# Patient Record
Sex: Female | Born: 1980 | Race: Black or African American | Hispanic: No | Marital: Single | Smoking: Current every day smoker
Health system: Southern US, Community
[De-identification: ages and names within clinical notes are randomized; demographics above are authoritative.]

## PROBLEM LIST (undated history)

## (undated) DIAGNOSIS — D649 Anemia, unspecified: Secondary | ICD-10-CM

---

## 2006-06-08 ENCOUNTER — Emergency Department (HOSPITAL_COMMUNITY): Admission: EM | Admit: 2006-06-08 | Discharge: 2006-06-08 | Payer: Self-pay | Admitting: Emergency Medicine

## 2006-08-08 ENCOUNTER — Emergency Department: Payer: Self-pay | Admitting: Emergency Medicine

## 2006-08-14 ENCOUNTER — Emergency Department: Payer: Self-pay | Admitting: Emergency Medicine

## 2012-08-02 ENCOUNTER — Encounter (HOSPITAL_COMMUNITY): Payer: Self-pay | Admitting: *Deleted

## 2012-08-02 ENCOUNTER — Emergency Department (HOSPITAL_COMMUNITY): Payer: No Typology Code available for payment source

## 2012-08-02 ENCOUNTER — Emergency Department (HOSPITAL_COMMUNITY)
Admission: EM | Admit: 2012-08-02 | Discharge: 2012-08-02 | Disposition: A | Discharge status: Home / Self Care | Payer: No Typology Code available for payment source | Attending: Emergency Medicine | Admitting: Emergency Medicine

## 2012-08-02 DIAGNOSIS — IMO0002 Reserved for concepts with insufficient information to code with codable children: Secondary | ICD-10-CM | POA: Insufficient documentation

## 2012-08-02 DIAGNOSIS — Y939 Activity, unspecified: Secondary | ICD-10-CM | POA: Insufficient documentation

## 2012-08-02 DIAGNOSIS — Y92410 Unspecified street and highway as the place of occurrence of the external cause: Secondary | ICD-10-CM | POA: Insufficient documentation

## 2012-08-02 DIAGNOSIS — S0993XA Unspecified injury of face, initial encounter: Secondary | ICD-10-CM | POA: Insufficient documentation

## 2012-08-02 DIAGNOSIS — S298XXA Other specified injuries of thorax, initial encounter: Secondary | ICD-10-CM | POA: Insufficient documentation

## 2012-08-02 DIAGNOSIS — T148XXA Other injury of unspecified body region, initial encounter: Secondary | ICD-10-CM

## 2012-08-02 DIAGNOSIS — S46909A Unspecified injury of unspecified muscle, fascia and tendon at shoulder and upper arm level, unspecified arm, initial encounter: Secondary | ICD-10-CM | POA: Insufficient documentation

## 2012-08-02 DIAGNOSIS — R110 Nausea: Secondary | ICD-10-CM | POA: Insufficient documentation

## 2012-08-02 DIAGNOSIS — S4980XA Other specified injuries of shoulder and upper arm, unspecified arm, initial encounter: Secondary | ICD-10-CM | POA: Insufficient documentation

## 2012-08-02 MED ORDER — ONDANSETRON 4 MG PO TBDP
8.0000 mg | ORAL_TABLET | Freq: Once | ORAL | Status: AC
  Administered 2012-08-02: 8 mg via ORAL
  Filled 2012-08-02: qty 2

## 2012-08-02 MED ORDER — KETOROLAC TROMETHAMINE 60 MG/2ML IM SOLN
60.0000 mg | Freq: Once | INTRAMUSCULAR | Status: AC
  Administered 2012-08-02: 60 mg via INTRAMUSCULAR
  Filled 2012-08-02: qty 2

## 2012-08-02 MED ORDER — HYDROCODONE-ACETAMINOPHEN 5-325 MG PO TABS: 2.0000 | ORAL_TABLET | ORAL | Status: DC | PRN

## 2012-08-02 MED ORDER — CYCLOBENZAPRINE HCL 10 MG PO TABS: 10.0000 mg | ORAL_TABLET | Freq: Two times a day (BID) | ORAL | Status: AC | PRN

## 2012-08-02 MED ORDER — DIAZEPAM 5 MG PO TABS
10.0000 mg | ORAL_TABLET | Freq: Once | ORAL | Status: AC
  Administered 2012-08-02: 10 mg via ORAL
  Filled 2012-08-02 (×2): qty 1

## 2012-08-02 NOTE — ED Notes (Signed)
C-collar removed per ED PA during assessment. Patient transported to X-ray
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
## 2012-08-02 NOTE — ED Notes (Signed)
Medicated for c/o nausea
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
## 2012-08-04 NOTE — ED Provider Notes (Signed)
Medical screening examination/treatment/procedure(s) were performed by non-physician practitioner and as supervising physician I was immediately available for consultation/collaboration.  Toy Baker, MD 08/04/12 [phone]

## 2014-06-28 ENCOUNTER — Encounter (HOSPITAL_COMMUNITY): Payer: Self-pay | Admitting: Emergency Medicine

## 2014-06-28 ENCOUNTER — Emergency Department (HOSPITAL_COMMUNITY): Payer: Medicaid Other

## 2014-06-28 ENCOUNTER — Emergency Department (HOSPITAL_COMMUNITY)
Admission: EM | Admit: 2014-06-28 | Discharge: 2014-06-28 | Disposition: A | Discharge status: Home / Self Care | Payer: Medicaid Other | Attending: Emergency Medicine | Admitting: Emergency Medicine

## 2014-06-28 DIAGNOSIS — Y9389 Activity, other specified: Secondary | ICD-10-CM | POA: Insufficient documentation

## 2014-06-28 DIAGNOSIS — Z720 Tobacco use: Secondary | ICD-10-CM | POA: Insufficient documentation

## 2014-06-28 DIAGNOSIS — X58XXXA Exposure to other specified factors, initial encounter: Secondary | ICD-10-CM | POA: Diagnosis not present

## 2014-06-28 DIAGNOSIS — Z862 Personal history of diseases of the blood and blood-forming organs and certain disorders involving the immune mechanism: Secondary | ICD-10-CM | POA: Insufficient documentation

## 2014-06-28 DIAGNOSIS — M25561 Pain in right knee: Secondary | ICD-10-CM | POA: Diagnosis present

## 2014-06-28 DIAGNOSIS — Y9289 Other specified places as the place of occurrence of the external cause: Secondary | ICD-10-CM | POA: Diagnosis not present

## 2014-06-28 DIAGNOSIS — S86911A Strain of unspecified muscle(s) and tendon(s) at lower leg level, right leg, initial encounter: Secondary | ICD-10-CM

## 2014-06-28 DIAGNOSIS — Y998 Other external cause status: Secondary | ICD-10-CM | POA: Insufficient documentation

## 2014-06-28 HISTORY — DX: Anemia, unspecified: D64.9

## 2014-06-28 MED ORDER — TETRACAINE HCL 0.5 % OP SOLN: 2.0000 [drp] | Freq: Once | OPHTHALMIC | Status: DC

## 2014-06-28 MED ORDER — FLUORESCEIN SODIUM 10 % IJ SOLN: 500.0000 mg | Freq: Once | INTRAMUSCULAR | Status: DC

## 2014-06-28 MED ORDER — HYDROCODONE-ACETAMINOPHEN 5-325 MG PO TABS: 1.0000 | ORAL_TABLET | Freq: Four times a day (QID) | ORAL | Status: AC | PRN

## 2014-06-28 NOTE — ED Provider Notes (Signed)
CSN: [id number]     Arrival date & time 06/28/14  1312 History  This chart was scribed for non-physician practitioner working with Joanna Golden, by Richarda Overlieichard Holland, ED Scribe. This patient was seen in room TR10C/TR10C and the patient's care was started at 1:53 PM.    Chief Complaint  Patient presents with  . Knee Pain    The history is provided by the patient. No language interpreter was used.   HPI Comments: Joanna Golden is a 33 y.o. female who presents to the Emergency Department complaining of right knee pain that started a few days ago. Pt states she was doing lunges with her son about 3 days ago when she noticed the pain after about her 5th lunge. She denies falling or any previous injuries to the area. Pt states she took advil yesterday which failed to relieve her pain. She states her last period was about 3 weeks ago. Pt reports no pertinent past medical history. She reports NKDA.   Past Medical History  Diagnosis Date  . Anemia    Past Surgical History  Procedure Laterality Date  . Cesarean section     No family history on file. History  Substance Use Topics  . Smoking status: Current Every Day Smoker  . Smokeless tobacco: Not on file  . Alcohol Use: No   OB History    No data available     Review of Systems  Musculoskeletal: Positive for arthralgias.  All other systems reviewed and are negative.   Allergies  Review of patient's allergies indicates no known allergies.  Home Medications   Prior to Admission medications   Medication Sig Start Date End Date Taking? Authorizing Provider  cyclobenzaprine (FLEXERIL) 10 MG tablet Take 1 tablet (10 mg total) by mouth 2 (two) times daily as needed for muscle spasms. 08/02/12   Emilia BeckKaitlyn Szekalski, PA-C  HYDROcodone-acetaminophen (NORCO/VICODIN) 5-325 MG per tablet Take 2 tablets by mouth every 4 (four) hours as needed for pain. 08/02/12   Kaitlyn Szekalski, PA-C   BP 123/69 mmHg  Pulse 65  Temp(Src) 97.9 F  (36.6 C) (Oral)  SpO2 100% Physical Exam  Constitutional: She is oriented to person, place, and time. She appears well-developed and well-nourished. No distress.  HENT:  Head: Normocephalic and atraumatic.  Eyes: Right eye exhibits no discharge. Left eye exhibits no discharge.  Neck: Neck supple. No tracheal deviation present.  Cardiovascular: Normal rate.   Pulmonary/Chest: Effort normal. No respiratory distress.  Abdominal: She exhibits no distension.  Musculoskeletal:  Generalized tenderness to the right knee. Pt has full rom. Pulses intact  Neurological: She is alert and oriented to person, place, and time.  Skin: Skin is warm. She is not diaphoretic.  Psychiatric: She has a normal mood and affect. Her behavior is normal.  Nursing note and vitals reviewed.   ED Course  Procedures   DIAGNOSTIC STUDIES: Oxygen Saturation is 100% on RA, normal by my interpretation.    COORDINATION OF CARE: 1:57 PM Discussed treatment plan with pt at bedside and pt agreed to plan.   Labs Review Labs Reviewed - No data to display  Imaging Review Dg Knee Complete 4 Views Right  06/28/2014   CLINICAL DATA:  Right knee pain since doing lunges 2 days ago. Initial encounter.  EXAM: RIGHT KNEE - COMPLETE 4+ VIEW  COMPARISON:  Plain films right knee 06/08/2006.  FINDINGS: Imaged bones, joints and soft tissues appear normal.  IMPRESSION: Normal examination.   Electronically Signed   By: Maisie Fushomas  Dalessio M.D.   On: 06/28/2014 15:27     EKG Interpretation None      MDM   Final diagnoses:  Knee strain, right, initial encounter    No acute bony abnormality noted. Likely strain. Will treat with hydrocodone and give ortho referral for continued symptoms  I personally performed the services described in this documentation, which was scribed in my presence. The recorded information has been reviewed and is accurate.      Teressa LowerVrinda Georgianne Gritz, NP 06/28/14 1637  Joanna Creasehristopher J. Pollina,  MD 06/29/14 [phone]

## 2014-06-28 NOTE — Discharge Instructions (Signed)

## 2014-06-28 NOTE — ED Notes (Signed)
States has been trying to do an exercise video,while doing lunges, had right knee pain which continues to worsen.

## 2016-06-25 IMAGING — CR DG KNEE COMPLETE 4+V*R*
4 series · 4 of 4 positions shown · non-contrast
Comparison: Plain films right knee 06/08/2006.

CLINICAL DATA: Right knee pain since doing lunges 2 days ago.
Initial encounter.

EXAM:
RIGHT KNEE - COMPLETE 4+ VIEW

[knee ap]
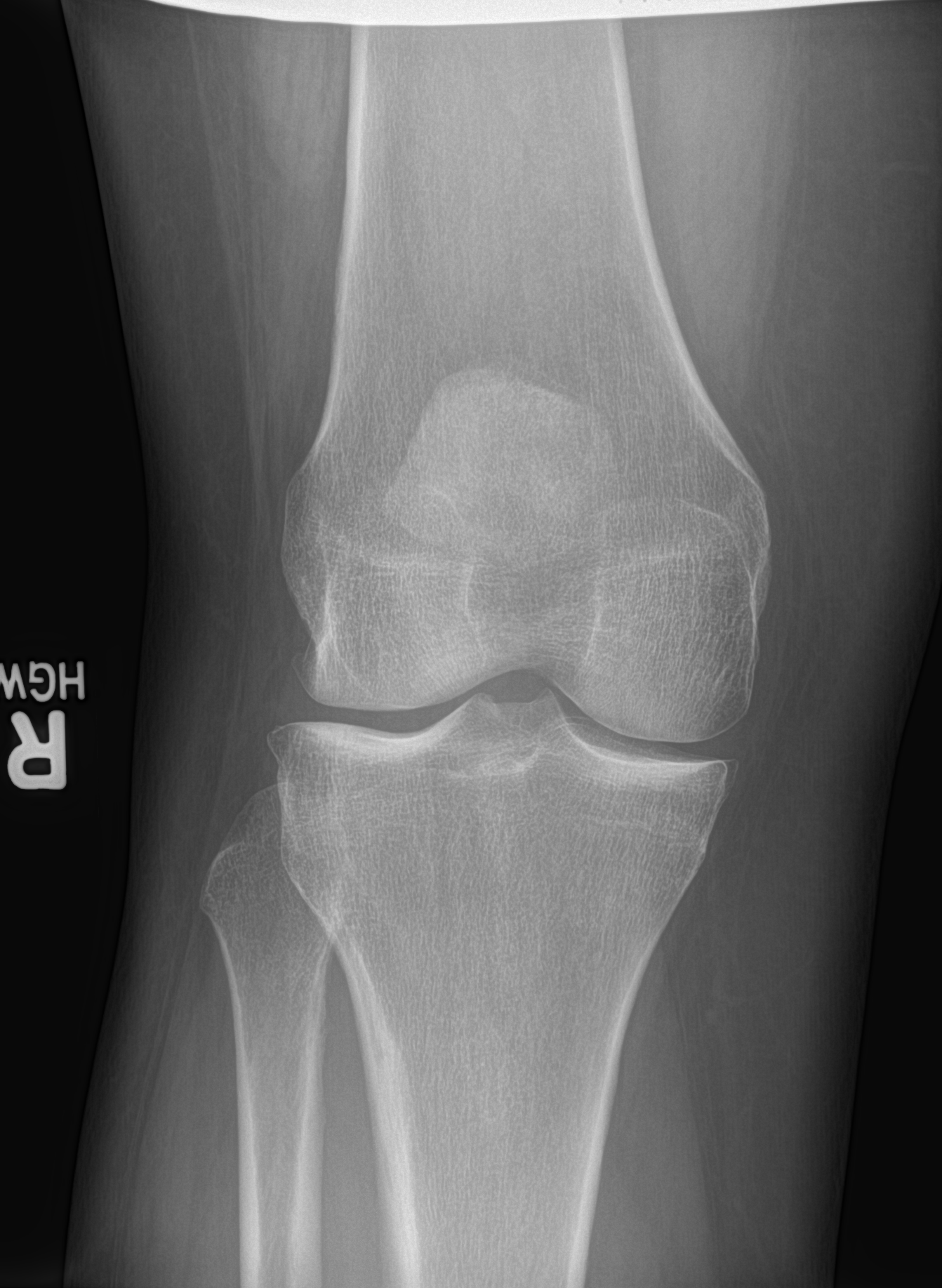

[knee obl (1 of 2)]
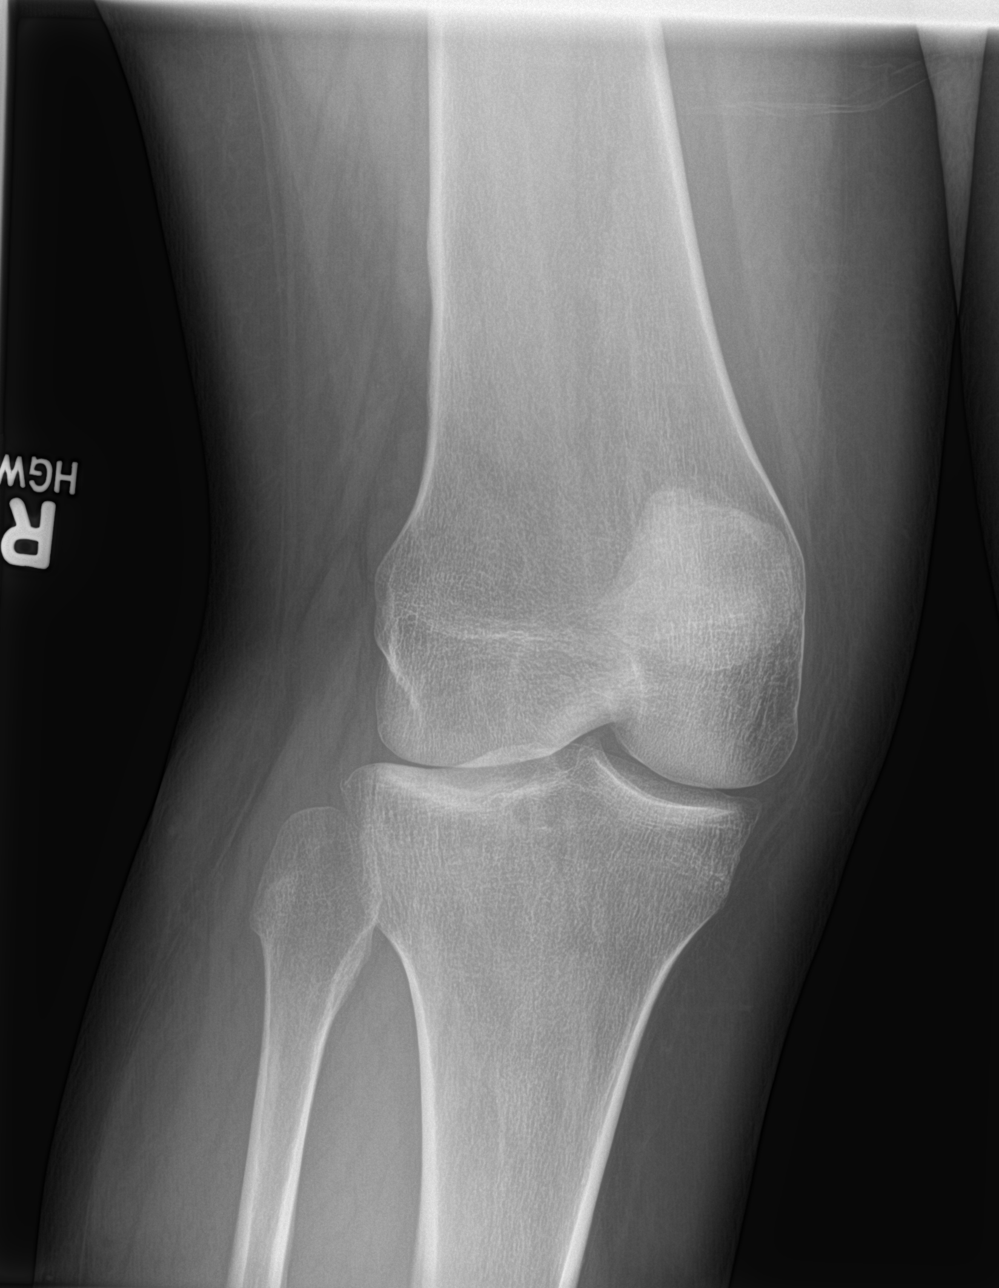

[knee obl (2 of 2)]
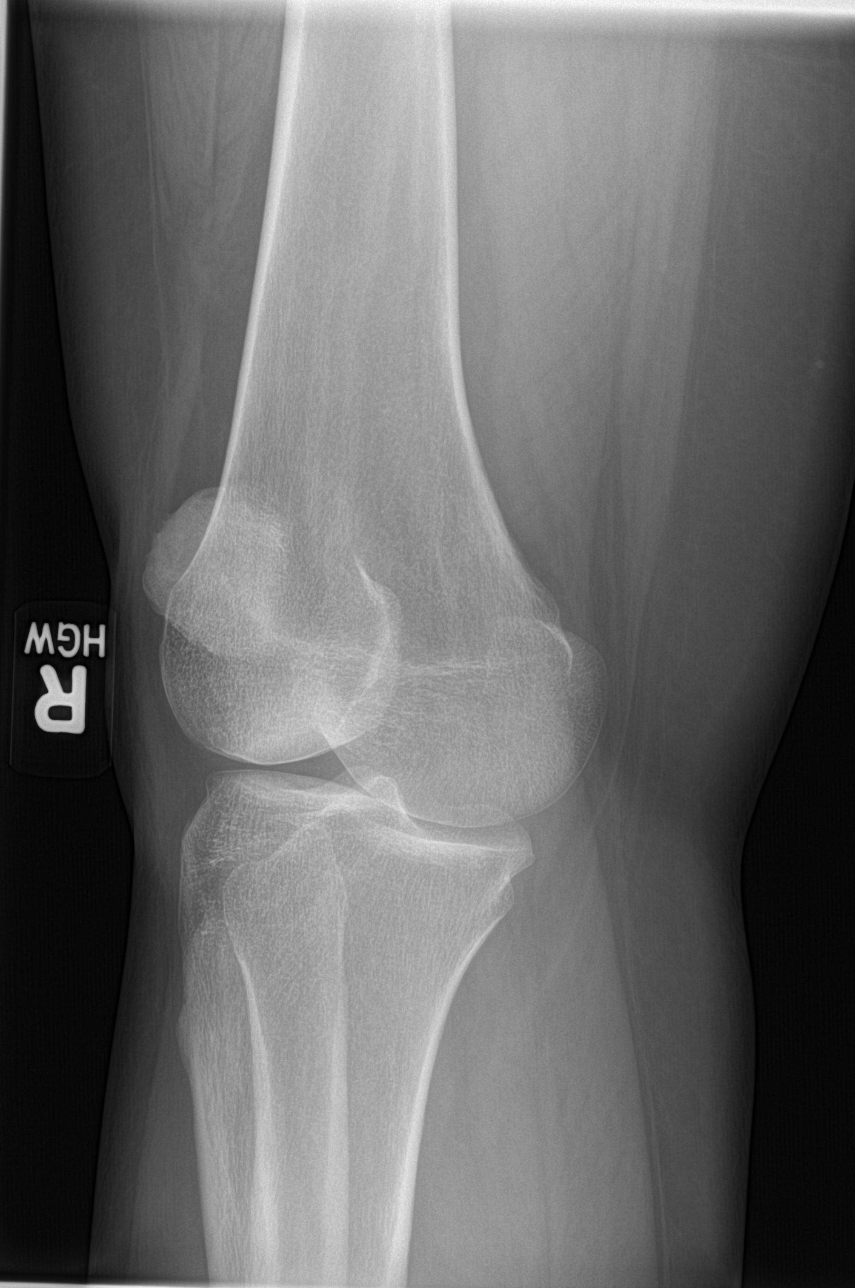

[knee lat]
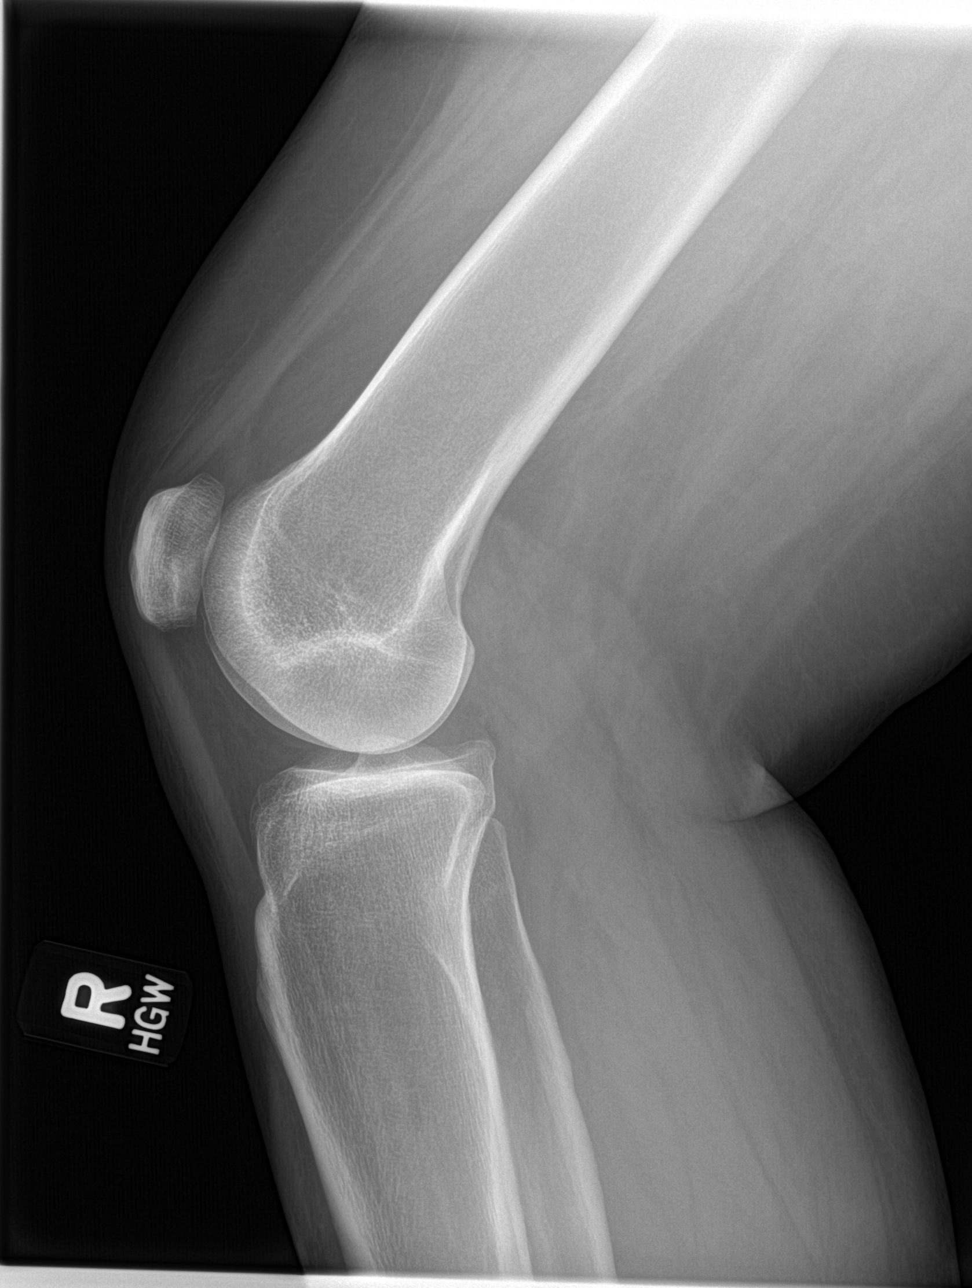

[4 of 4 positions shown; findings below may reference images not displayed]

FINDINGS: Imaged bones, joints and soft tissues appear normal.
IMPRESSION: Normal examination.
